# Patient Record
Sex: Female | Born: 1985 | Race: Black or African American | Hispanic: No | Marital: Single | Smoking: Current every day smoker
Health system: Southern US, Community
[De-identification: ages and names within clinical notes are randomized; demographics above are authoritative.]

## PROBLEM LIST (undated history)

## (undated) DIAGNOSIS — R569 Unspecified convulsions: Secondary | ICD-10-CM

---

## 2017-02-27 ENCOUNTER — Emergency Department (HOSPITAL_COMMUNITY)
Admission: EM | Admit: 2017-02-27 | Discharge: 2017-02-27 | Disposition: A | Discharge status: Home / Self Care | Payer: Medicaid Other | Attending: Emergency Medicine | Admitting: Emergency Medicine

## 2017-02-27 ENCOUNTER — Emergency Department (HOSPITAL_COMMUNITY): Payer: Medicaid Other

## 2017-02-27 ENCOUNTER — Encounter (HOSPITAL_COMMUNITY): Payer: Self-pay

## 2017-02-27 DIAGNOSIS — Y929 Unspecified place or not applicable: Secondary | ICD-10-CM | POA: Diagnosis not present

## 2017-02-27 DIAGNOSIS — Y999 Unspecified external cause status: Secondary | ICD-10-CM | POA: Insufficient documentation

## 2017-02-27 DIAGNOSIS — W1781XA Fall down embankment (hill), initial encounter: Secondary | ICD-10-CM | POA: Diagnosis not present

## 2017-02-27 DIAGNOSIS — S8991XA Unspecified injury of right lower leg, initial encounter: Secondary | ICD-10-CM | POA: Diagnosis present

## 2017-02-27 DIAGNOSIS — S9031XA Contusion of right foot, initial encounter: Secondary | ICD-10-CM | POA: Diagnosis not present

## 2017-02-27 DIAGNOSIS — S8011XA Contusion of right lower leg, initial encounter: Secondary | ICD-10-CM | POA: Insufficient documentation

## 2017-02-27 DIAGNOSIS — Z23 Encounter for immunization: Secondary | ICD-10-CM | POA: Insufficient documentation

## 2017-02-27 DIAGNOSIS — Y939 Activity, unspecified: Secondary | ICD-10-CM | POA: Insufficient documentation

## 2017-02-27 DIAGNOSIS — F17210 Nicotine dependence, cigarettes, uncomplicated: Secondary | ICD-10-CM | POA: Insufficient documentation

## 2017-02-27 DIAGNOSIS — T07XXXA Unspecified multiple injuries, initial encounter: Secondary | ICD-10-CM

## 2017-02-27 HISTORY — DX: Unspecified convulsions: R56.9

## 2017-02-27 LAB — CBG MONITORING, ED: GLUCOSE-CAPILLARY: 56 mg/dL — AB (ref 65–99)

## 2017-02-27 MED ORDER — TETANUS-DIPHTH-ACELL PERTUSSIS 5-2.5-18.5 LF-MCG/0.5 IM SUSP
0.5000 mL | Freq: Once | INTRAMUSCULAR | Status: AC
  Administered 2017-02-27: 0.5 mL via INTRAMUSCULAR
  Filled 2017-02-27: qty 0.5

## 2017-02-27 MED ORDER — BACITRACIN ZINC 500 UNIT/GM EX OINT
TOPICAL_OINTMENT | CUTANEOUS | Status: AC
  Administered 2017-02-27: 1
  Filled 2017-02-27: qty 0.9

## 2017-02-27 NOTE — ED Notes (Signed)
Patient transported to X-ray 

## 2017-02-27 NOTE — Discharge Instructions (Signed)
Your vital signs within normal limits. Your x-rays are negative for fracture or dislocation. Your blood sugar was slightly below the normal. Please be sure you eat a complete meal after leaving the emergency department. He cleansed the wounds daily with soap and water, and apply a Neosporin bandage until the wounds have healed.[facility] for follow-up of your wounds.

## 2017-02-27 NOTE — ED Provider Notes (Signed)
[facility]EM[facility]ENC[facility] DEPT Provider Note   CSN: [id number] Arrival date & time: 02/27/17  1812     History   Chief Complaint Chief Complaint  Patient presents with  . Foot Pain    HPI Gail Summers is a 31 y.o. female.  Patient is a 31 year old female who presents to the emergency department with complaint of injury to her lower extremity.  The patient states that just prior to her arrival she fell down a he'll, and injured her right knee and right ankle area. She states that she has been drinking beer since about 12 noon. She denies hitting her head or hurting any other areas. She denies any loss of consciousness. It is of note she has a history of seizures, and she denies any seizure activity at this time.   The history is provided by the patient.  Foot Pain     Past Medical History:  Diagnosis Date  . Seizures (HCC)     There are no active problems to display for this patient.   Past Surgical History:  Procedure Laterality Date  . CESAREAN SECTION      OB History    No data available       Home Medications    Prior to Admission medications   Not on File    Family History No family history on file.  Social History Social History  Substance Use Topics  . Smoking status: Current Every Day Smoker    Types: Cigarettes  . Smokeless tobacco: Never Used  . Alcohol use Yes     Comment: drinks beer daily     Allergies   Patient has no known allergies.   Review of Systems Review of Systems  Skin:       Abrasions noted at the area just below the knee, and also one on the dorsum of the foot.  Psychiatric/Behavioral: The patient is nervous/anxious.      Physical Exam Updated Vital Signs BP 118/71 (BP Location: Right Arm)   Pulse 85   Temp 98.6 F (37 C) (Oral)   Resp 16   Wt 63.5 kg   LMP 01/21/2017   SpO2 100%   Physical Exam  HENT:  Smell of ETOH on breath.  Pulmonary/Chest:  No chest wall pain or deformity.  Musculoskeletal:   Right lower leg: She exhibits tenderness.       Legs: There is no palpable step off of the cervical, thoracic, or lumbar spine.  Neurological: She has normal strength. No sensory deficit. Gait normal.  Gait slow but steady.  Psychiatric: Her speech is slurred.  Patient states she's been drinking alcohol since about noon today. Speech is somewhat slurred. There is no suicidal or homicidal ideation. There is no suicidal plan. There is some difficulty in completing a thought.     ED Treatments / Results  Labs (all labs ordered are listed, but only abnormal results are displayed) Labs Reviewed  CBG MONITORING, ED - Abnormal; Notable for the following:       Result Value   Glucose-Capillary 56 (*)    All other components within normal limits    EKG  EKG Interpretation None       Radiology No results found.  Procedures Procedures (including critical care time)  Medications Ordered in ED Medications  Tdap (BOOSTRIX) injection 0.5 mL (0.5 mLs Intramuscular Given 02/27/17 1852)     Initial Impression / Assessment and Plan / ED Course  I have reviewed the triage vital signs and the nursing  notes.  Pertinent labs & imaging results that were available during my care of the patient were reviewed by me and considered in my medical decision making (see chart for details).      Final Clinical Impressions(s) / ED Diagnoses MDM Patient states she's been drinking since about 12 noon today. She fell down a hill and injured the right lower extremity.  X-ray of the right ankle is negative for fracture or dislocation or joint effusion. X-ray of the knee is negative for fracture or dislocation.  The wounds have been dressed. The patient's tetanus status has been updated. Patient is to follow-up with the [facility] or return to the emergency department if any changes, problems, or concerns. The patient is more coherent. She is able to ambulate from her room to the bathroom. She has been  able to eat and drink here in the emergency department without problem. Pt released to the care of a friend.   Final diagnoses:  Abrasions of multiple sites  Contusion, multiple sites    New Prescriptions New Prescriptions   No medications on file     Ivery Quale, Cordelia Poche 02/27/17 1943    Samuel Jester, DO 03/02/17 1548

## 2017-02-27 NOTE — ED Notes (Signed)
Pt given soda and PB crackers

## 2017-02-27 NOTE — ED Triage Notes (Signed)
Pt states she fell down a hill tonight. States she has been drinking beer since 12 pm. Complaining of right ankle pain and has an open area to top of foot and opening to right knee

## 2018-10-27 IMAGING — DX DG ANKLE COMPLETE 3+V*R*
3 series · 3 of 3 positions shown · non-contrast
Comparison: None.

CLINICAL DATA: Pain to the anterior ankle with laceration.

EXAM:
RIGHT ANKLE - COMPLETE 3+ VIEW

[ankle ap]
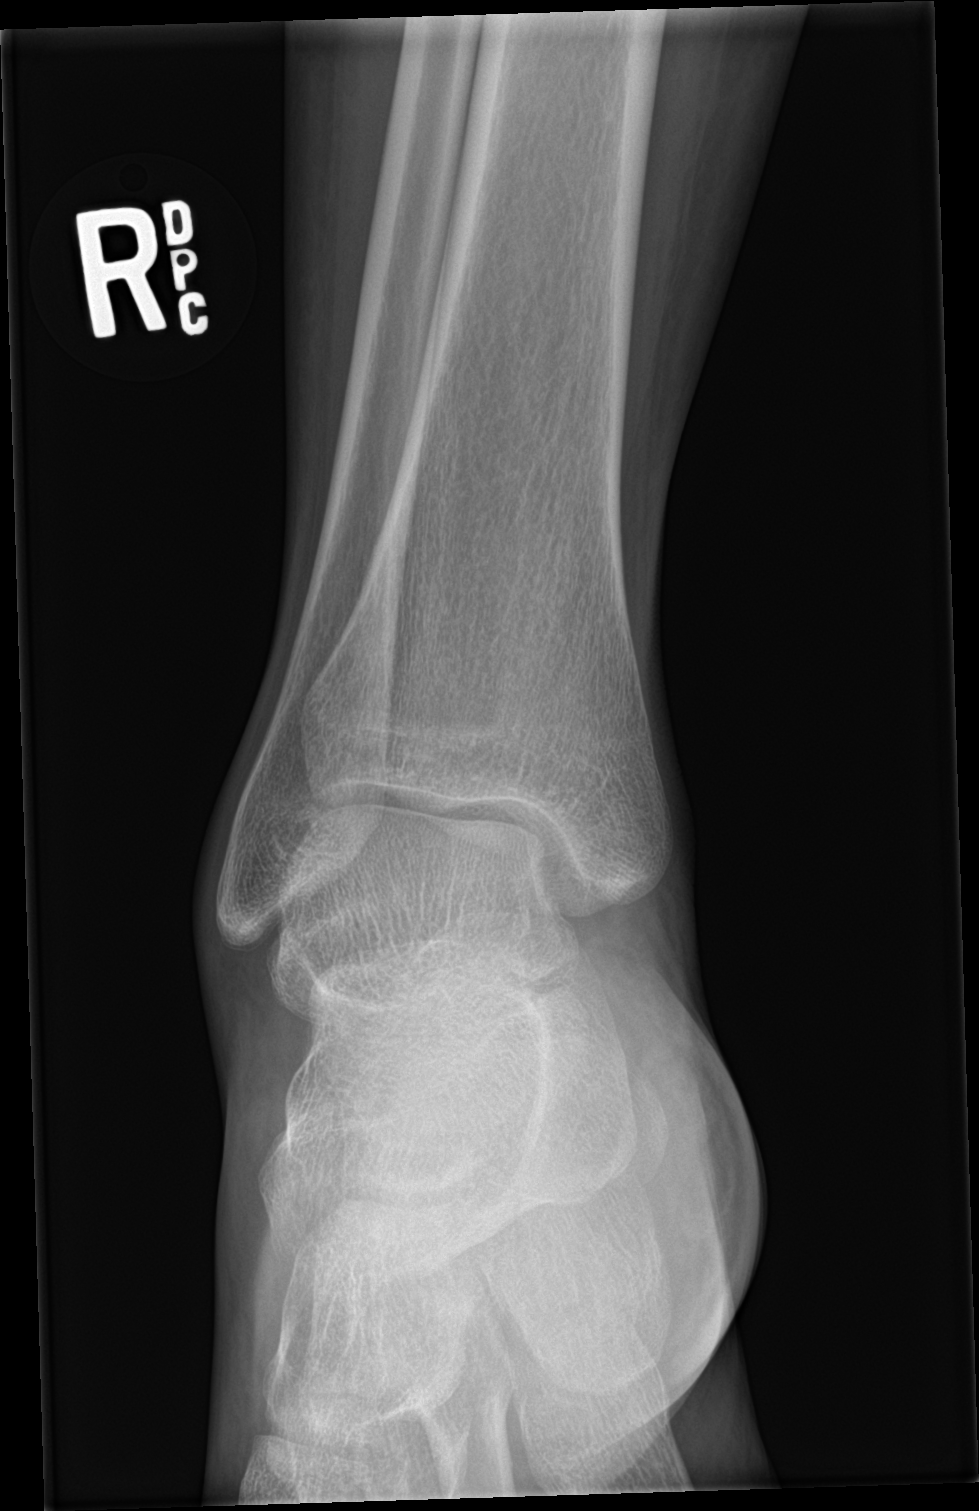

[ankle obl]
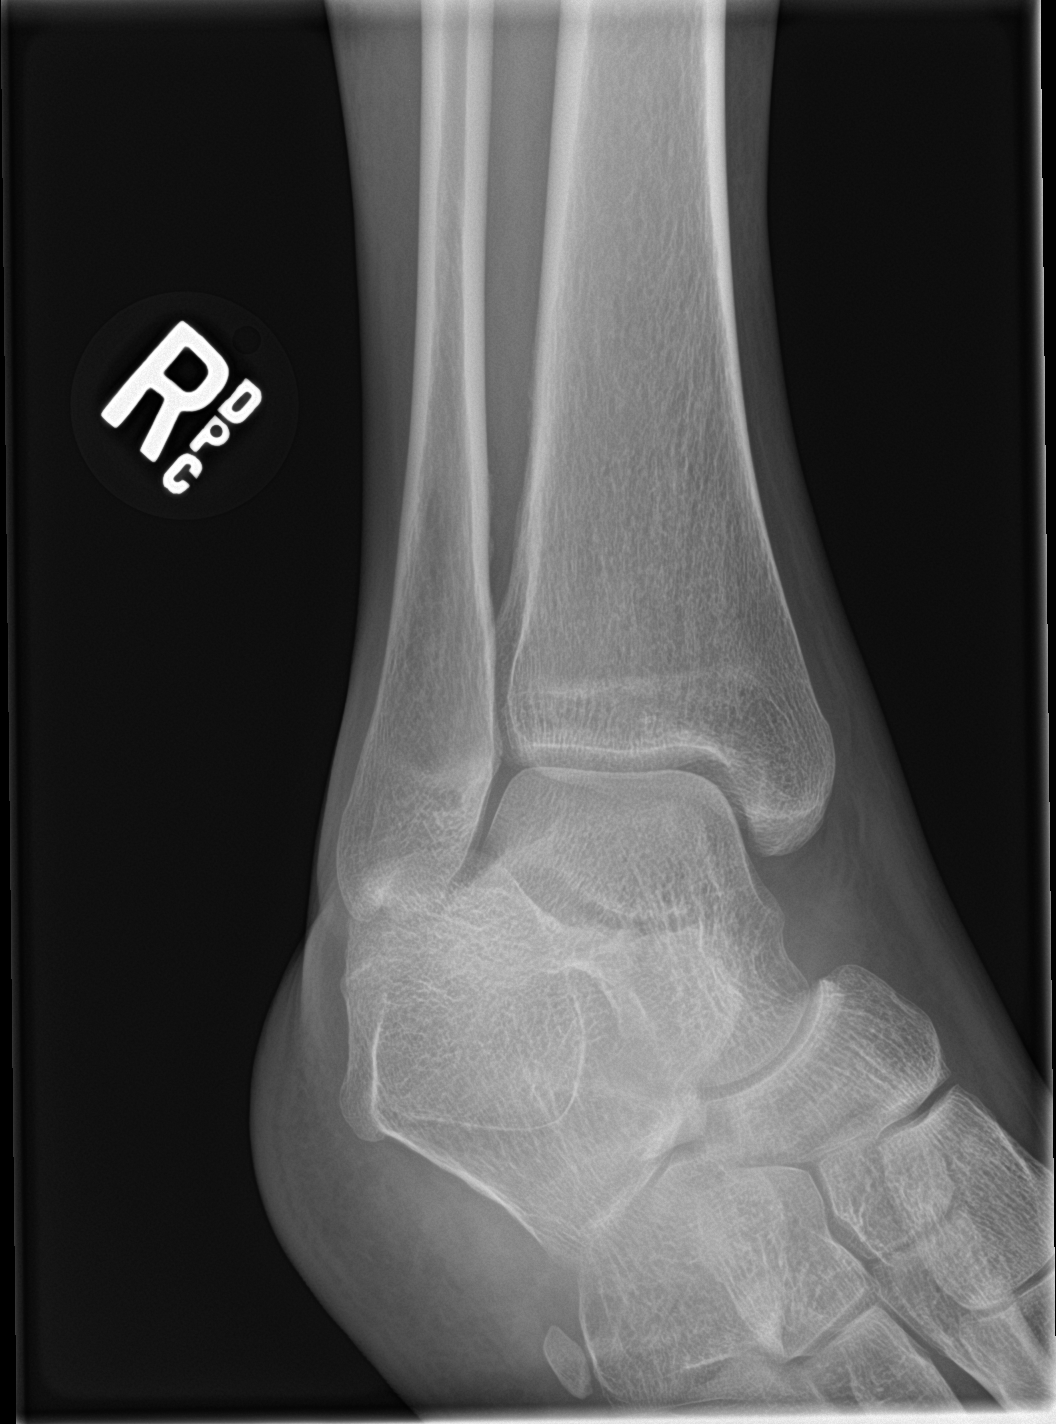

[ankle lat]
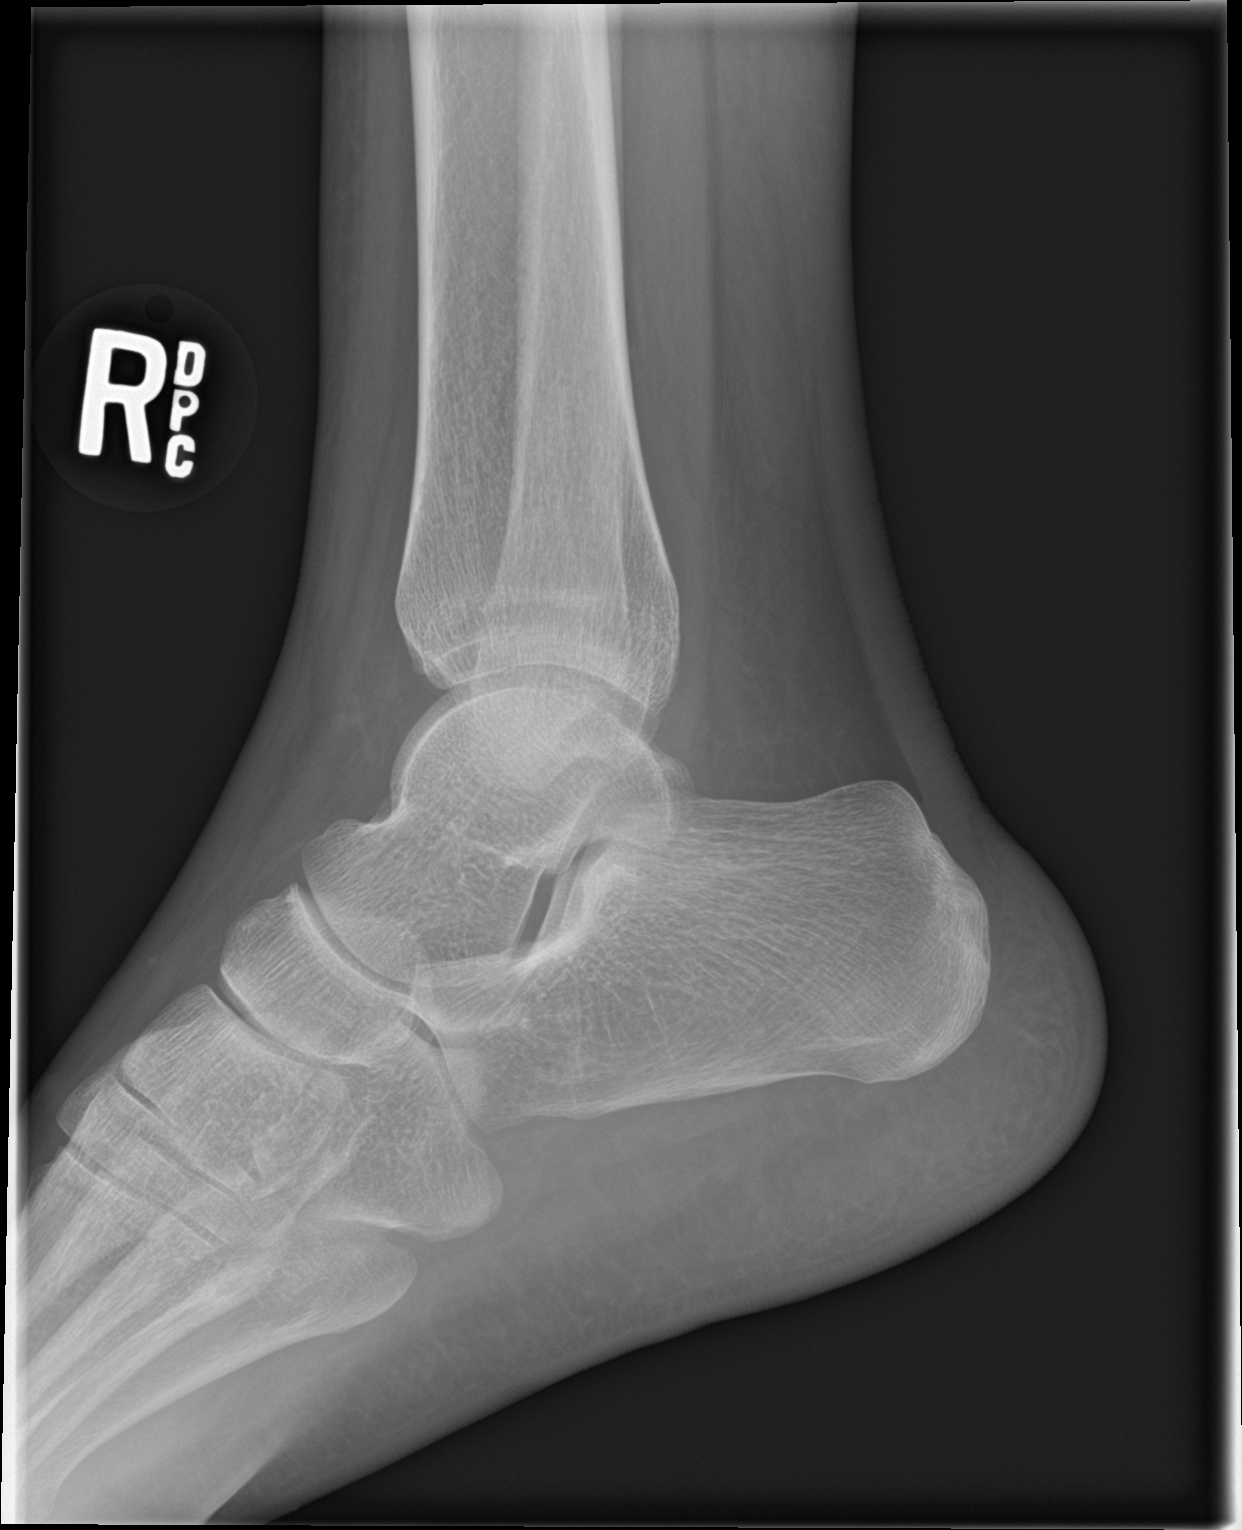

[3 of 3 positions shown; findings below may reference images not displayed]

FINDINGS: There is no evidence of fracture, dislocation, or joint effusion.
There is no evidence of arthropathy or other focal bone abnormality.
Soft tissues are unremarkable.
IMPRESSION: Negative.
